# Patient Record
Sex: Female | Born: 2000
Health system: Southern US, Community
[De-identification: ages and names within clinical notes are randomized; demographics above are authoritative.]

## PROBLEM LIST (undated history)

## (undated) DIAGNOSIS — F32A Depression, unspecified: Secondary | ICD-10-CM

## (undated) DIAGNOSIS — F419 Anxiety disorder, unspecified: Secondary | ICD-10-CM

## (undated) DIAGNOSIS — J020 Streptococcal pharyngitis: Secondary | ICD-10-CM

## (undated) DIAGNOSIS — F329 Major depressive disorder, single episode, unspecified: Secondary | ICD-10-CM

## (undated) DIAGNOSIS — F4310 Post-traumatic stress disorder, unspecified: Secondary | ICD-10-CM

## (undated) DIAGNOSIS — G43909 Migraine, unspecified, not intractable, without status migrainosus: Secondary | ICD-10-CM

## (undated) HISTORY — DX: Migraine, unspecified, not intractable, without status migrainosus: G43.909

## (undated) HISTORY — DX: Post-traumatic stress disorder, unspecified: F43.10

## (undated) HISTORY — DX: Depression, unspecified: F32.A

## (undated) HISTORY — PX: TONSILLECTOMY: SUR1361

## (undated) HISTORY — DX: Anxiety disorder, unspecified: F41.9

---
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
## 2019-02-03 DIAGNOSIS — R42 Dizziness and giddiness: Secondary | ICD-10-CM | POA: Diagnosis not present

## 2019-02-03 DIAGNOSIS — F419 Anxiety disorder, unspecified: Secondary | ICD-10-CM | POA: Diagnosis not present

## 2019-02-03 DIAGNOSIS — E559 Vitamin D deficiency, unspecified: Secondary | ICD-10-CM | POA: Diagnosis not present

## 2019-02-06 DIAGNOSIS — R42 Dizziness and giddiness: Secondary | ICD-10-CM | POA: Diagnosis not present

## 2019-02-06 DIAGNOSIS — E559 Vitamin D deficiency, unspecified: Secondary | ICD-10-CM | POA: Diagnosis not present

## 2019-03-25 DIAGNOSIS — R519 Headache, unspecified: Secondary | ICD-10-CM | POA: Diagnosis not present

## 2019-03-25 DIAGNOSIS — G8929 Other chronic pain: Secondary | ICD-10-CM | POA: Diagnosis not present

## 2019-03-25 DIAGNOSIS — R42 Dizziness and giddiness: Secondary | ICD-10-CM | POA: Diagnosis not present

## 2019-06-04 DIAGNOSIS — F331 Major depressive disorder, recurrent, moderate: Secondary | ICD-10-CM | POA: Diagnosis not present

## 2019-06-11 DIAGNOSIS — F331 Major depressive disorder, recurrent, moderate: Secondary | ICD-10-CM | POA: Diagnosis not present

## 2019-08-27 DIAGNOSIS — Z3040 Encounter for surveillance of contraceptives, unspecified: Secondary | ICD-10-CM | POA: Diagnosis not present

## 2019-08-27 DIAGNOSIS — Z3046 Encounter for surveillance of implantable subdermal contraceptive: Secondary | ICD-10-CM | POA: Diagnosis not present

## 2019-10-08 ENCOUNTER — Ambulatory Visit (INDEPENDENT_AMBULATORY_CARE_PROVIDER_SITE_OTHER): Payer: BC Managed Care – PPO | Admitting: Neurology

## 2019-10-08 ENCOUNTER — Other Ambulatory Visit: Payer: Self-pay

## 2019-10-08 ENCOUNTER — Encounter: Payer: Self-pay | Admitting: Neurology

## 2019-10-08 VITALS — Temp 97.1°F | Ht 60.0 in | Wt 176.0 lb

## 2019-10-08 DIAGNOSIS — R55 Syncope and collapse: Secondary | ICD-10-CM | POA: Diagnosis not present

## 2019-10-08 DIAGNOSIS — F418 Other specified anxiety disorders: Secondary | ICD-10-CM | POA: Diagnosis not present

## 2019-10-08 DIAGNOSIS — IMO0002 Reserved for concepts with insufficient information to code with codable children: Secondary | ICD-10-CM | POA: Insufficient documentation

## 2019-10-08 DIAGNOSIS — G43709 Chronic migraine without aura, not intractable, without status migrainosus: Secondary | ICD-10-CM | POA: Diagnosis not present

## 2019-10-08 MED ORDER — ELETRIPTAN HYDROBROMIDE 40 MG PO TABS: 40.0000 mg | ORAL_TABLET | ORAL | 6 refills | Status: DC | PRN

## 2019-10-08 MED ORDER — VENLAFAXINE HCL ER 37.5 MG PO CP24: 37.5000 mg | ORAL_CAPSULE | Freq: Every day | ORAL | 11 refills | Status: DC

## 2019-10-08 NOTE — Progress Notes (Signed)
PATIENT: Lisa Mccoy DOB: August 07, 2000  Chief Complaint  Patient presents with  . New Patient (Initial Visit)    New Rm with mom- Pt reports she is here to discuss blurred vision, dizziness and faiting episodes. Last fainting spell was a couple of weeks ago and dizziness is present throughout everyday-- She reports she has tried medication for this but cannot remember the name. She also reports she struggles with migraine h/a     HISTORICAL  Lisa Mccoy is a 19 year old female, seen in request by her primary care PA Dara Lords for evaluation of chronic headache, frequent passing out spells, she is accompanied by her mother at today's clinical visit on Oct 08, 2019.  I have reviewed and summarized the referring note from the referring physician.  She is currently a Dietitian, also work part-time job as a Conservation officer, nature, reported long history of depression anxiety, taking Zoloft 50 mg daily,  She reported a history of migraine headaches since seventh grade, her typical migraine severe lateralized retro-orbital area pounding headache with associated light, noise sensitivity, over the years, her headache gradually getting worse, since 2019, she began to have daily headaches, for a while she was taking frequent over-the-counter medication such as Aleve, which does not help, she is no longer taking medicine regularly  Over the years, she was seen by primary care, neurologist in the past, for treatment of her depression anxiety, chronic migraine headaches, reported to have tried and failed amitriptyline, Topamax, Lexapro, also tried Imitrex, Maxalt as migraine abortive treatment with suboptimal response  She also reported frequent passing out spells since 2019, gradually worse, smaller spells on a daily basis, prolonged spells about couple times each month, she reported 1 typical spells 2 weeks ago, after standing on her feet for 8 or 9 hours working as a Conservation officer, nature, she went out with her friend  eating pizza, after 3-4 slice of pizza, drink soda, she was ready to check out, while waiting in line for couple minutes, she felt woozy headache, going to pass out, she was able to tell her friends, was helped by her friends to the car, then she could not remember anything from then on, then woke up 10 minutes later on her driveway, there is no tongue biting, urinary incontinence, no seizure-like activity, she did have a worsening headache after that  She reported long history of heavy menstruation, previously used progesterone implant, sometimes had menstruation lasting for 1 month, only at the beginning of 2021, she was changed to p.o. contraceptives, she has more regular menstruation now.   REVIEW OF SYSTEMS: Full 14 system review of systems performed and notable only for as above: Fatigue, blurry vision, eye pain, joint pain, aching muscles, allergy, runny nose, skin sensitivity, memory loss, confusion, headaches, numbness, weakness, dizziness, passing out, insomnia, sleepiness, restless leg, anxiety, depression, too much sleep, change in appetite disinterested activities, racing thoughts. All other review of systems were negative.  ALLERGIES: Allergies  Allergen Reactions  . Other     Seasonal Allergies      HOME MEDICATIONS: Current Outpatient Medications  Medication Sig Dispense Refill  . LO LOESTRIN FE 1 MG-10 MCG / 10 MCG tablet Take 1 tablet by mouth daily.    Marland Kitchen loratadine (CLARITIN) 10 MG tablet Take by mouth.    . naproxen sodium (ALEVE) 220 MG tablet Take 220 mg by mouth.    . sertraline (ZOLOFT) 50 MG tablet Take 50 mg by mouth 2 (two) times daily.  No current facility-administered medications for this visit.    PAST MEDICAL HISTORY: Past Medical History:  Diagnosis Date  . Anxiety   . Depression   . Migraine   . PTSD (post-traumatic stress disorder)   . Strep pharyngitis     PAST SURGICAL HISTORY: Past Surgical History:  Procedure Laterality Date  . TEAR  DUCT PROBING  2001   Performed in NICU  . TONSILLECTOMY    . TONSILLECTOMY AND ADENOIDECTOMY Bilateral 07-2010   Performed at [facility]    FAMILY HISTORY: Family History  Problem Relation Age of Onset  . Headache Mother        Headaches related to TMJ  . Depression Mother   . Anxiety disorder Mother   . Headache Father        Tension headaches  . Seizures Maternal Uncle   . Mental illness Paternal Aunt   . Drug abuse Paternal Aunt   . Depression Maternal Grandmother   . Anxiety disorder Maternal Grandmother   . Throat cancer Paternal Grandfather   . Asperger's syndrome Cousin   . ADD / ADHD Cousin     SOCIAL HISTORY: Social History   Socioeconomic History  . Marital status: Single    Spouse name: Not on file  . Number of children: Not on file  . Years of education: Not on file  . Highest education level: Not on file  Occupational History  . Not on file  Tobacco Use  . Smoking status: Never Smoker  . Smokeless tobacco: Never Used  . Tobacco comment: Dad "Vapes"  Substance and Sexual Activity  . Alcohol use: No    Alcohol/week: 0.0 standard drinks  . Drug use: No  . Sexual activity: Never    Birth control/protection: Implant  Other Topics Concern  . 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 She is doing very well. She enjoys sleeping, music, writing short stories, and watching Netflix movies.          Living with her parents. She has an adult-aged paternal half brother that does not live in the home. She has a paternal first cousin with Asperger's syndrome and another cousin with ADHD.       Her paternal aunt has been hospitalized for drug abuse and mental illness.       HC: 53.3 cm   Caffeine- uses everyday    Social Determinants of Health   Financial Resource Strain:   . Difficulty of Paying Living Expenses:   Food Insecurity:   . Worried About Sales executive in the Last Year:   . Arboriculturist in the Last Year:   Transportation Needs:   . Film/video editor (Medical):   Marland Kitchen Lack of Transportation (Non-Medical):   Physical Activity:   . Days of Exercise per Week:   . Minutes of Exercise per Session:   Stress:   . Feeling of Stress :   Social Connections:   . Frequency of Communication with Friends and Family:   . Frequency of Social Gatherings with Friends and Family:   . Attends Religious Services:   . Active Member of Clubs or Organizations:   . Attends Archivist Meetings:   Marland Kitchen Marital Status:   Intimate Partner Violence:   . Fear of Current or Ex-Partner:   . Emotionally Abused:   Marland Kitchen Physically Abused:   . Sexually Abused:  PHYSICAL EXAM   Vitals:   10/08/19 1523  Temp: (!) 97.1 F (36.2 C)  TempSrc: Temporal  Weight: 176 lb (79.8 kg)  Height: 5' (1.524 m)    Not recorded     Sitting down blood pressure 110/78, heart rate of 73, standing up 118/83 heart rate of 72, standing up for 3 minutes, 110/75 heart rate of 76  Body mass index is 34.37 kg/m.  PHYSICAL EXAMNIATION:  Gen: NAD, conversant, well nourised, well groomed                     Cardiovascular: Regular rate rhythm, no peripheral edema, warm, nontender. Eyes: Conjunctivae clear without exudates or hemorrhage Neck: Supple, no carotid bruits. Pulmonary: Clear to auscultation bilaterally   NEUROLOGICAL EXAM:  MENTAL STATUS: Speech:    Speech is normal; fluent and spontaneous with normal comprehension.  Cognition:     Orientation to time, place and person     Normal recent and remote memory     Normal Attention span and concentration     Normal Language, naming, repeating,spontaneous speech     Fund of knowledge   CRANIAL NERVES: CN II: Visual fields are full to confrontation. Pupils are round equal and briskly reactive to light. CN III, IV, VI: extraocular movement are normal. No ptosis. CN V: Facial sensation is intact to  light touch CN VII: Face is symmetric with normal eye closure  CN VIII: Hearing is normal to causal conversation. CN IX, X: Phonation is normal. CN XI: Head turning and shoulder shrug are intact  MOTOR: There is no pronator drift of out-stretched arms. Muscle bulk and tone are normal. Muscle strength is normal.  REFLEXES: Reflexes are 2+ and symmetric at the biceps, triceps, knees, and ankles. Plantar responses are flexor.  SENSORY: Intact to light touch, pinprick and vibratory sensation are intact in fingers and toes.  COORDINATION: There is no trunk or limb dysmetria noted.  GAIT/STANCE: Posture is normal. Gait is steady with normal steps, base, arm swing, and turning. Heel and toe walking are normal. Tandem gait is normal.  Romberg is absent.   DIAGNOSTIC DATA (LABS, IMAGING, TESTING) - I reviewed patient records, labs, notes, testing and imaging myself where available.   ASSESSMENT AND PLAN  Lisa Mccoy is a 19 y.o. female   Chronic migraine headaches Frequent passing out spells Depression anxiety  Most suggestive of syncope,  EEG   She did have a history of prolonged heavy menstruation for few years, only in recent months, she was started contraceptives, has better regular menstruation,  Laboratory evaluation to rule out anemia, thyroid malfunction  Effexor xr 37.5 mg daily as migraine prevention  Relpax as needed   Levert Feinstein, M.D. Ph.D.  [facility] [address], [address] [place], [place] [address] Ph: [phone] Fax: [phone]  CC: Stevphen Meuse, MD

## 2019-10-09 ENCOUNTER — Telehealth: Payer: Self-pay | Admitting: Neurology

## 2019-10-09 LAB — COMPREHENSIVE METABOLIC PANEL
ALT: 10 IU/L (ref 0–32)
AST: 13 IU/L (ref 0–40)
Albumin/Globulin Ratio: 1.7 (ref 1.2–2.2)
Albumin: 4.5 g/dL (ref 3.9–5.0)
Alkaline Phosphatase: 75 IU/L (ref 39–117)
BUN/Creatinine Ratio: 22 (ref 9–23)
BUN: 15 mg/dL (ref 6–20)
Bilirubin Total: <0.2 mg/dL (ref 0.0–1.2)
CO2: 24 mmol/L (ref 20–29)
Calcium: 9.7 mg/dL (ref 8.7–10.2)
Chloride: 103 mmol/L (ref 96–106)
Creatinine, Ser: 0.67 mg/dL (ref 0.57–1.00)
GFR calc Af Amer: 147 mL/min/{1.73_m2} (ref >59)
GFR calc non Af Amer: 128 mL/min/{1.73_m2} (ref >59)
Globulin, Total: 2.6 g/dL (ref 1.5–4.5)
Glucose: 85 mg/dL (ref 65–99)
Potassium: 4.3 mmol/L (ref 3.5–5.2)
Sodium: 138 mmol/L (ref 134–144)
Total Protein: 7.1 g/dL (ref 6.0–8.5)

## 2019-10-09 LAB — CBC WITH DIFFERENTIAL
Basophils Absolute: 0.1 10*3/uL (ref 0.0–0.2)
Basos: 1 %
EOS (ABSOLUTE): 0.3 10*3/uL (ref 0.0–0.4)
Eos: 3 %
Hematocrit: 38 % (ref 34.0–46.6)
Hemoglobin: 12.1 g/dL (ref 11.1–15.9)
Immature Grans (Abs): 0 10*3/uL (ref 0.0–0.1)
Immature Granulocytes: 0 %
Lymphocytes Absolute: 3.5 10*3/uL — ABNORMAL HIGH (ref 0.7–3.1)
Lymphs: 28 %
MCH: 26.8 pg (ref 26.6–33.0)
MCHC: 31.8 g/dL (ref 31.5–35.7)
MCV: 84 fL (ref 79–97)
Monocytes Absolute: 0.8 10*3/uL (ref 0.1–0.9)
Monocytes: 6 %
Neutrophils Absolute: 7.7 10*3/uL — ABNORMAL HIGH (ref 1.4–7.0)
Neutrophils: 62 %
RBC: 4.51 x10E6/uL (ref 3.77–5.28)
RDW: 12.5 % (ref 11.7–15.4)
WBC: 12.4 10*3/uL — ABNORMAL HIGH (ref 3.4–10.8)

## 2019-10-09 LAB — FERRITIN: Ferritin: 23 ng/mL (ref 15–77)

## 2019-10-09 LAB — RPR: RPR Ser Ql: NONREACTIVE

## 2019-10-09 LAB — VITAMIN B12: Vitamin B-12: 406 pg/mL (ref 232–1245)

## 2019-10-09 LAB — TSH: TSH: 3.82 u[IU]/mL (ref 0.450–4.500)

## 2019-10-09 MED ORDER — SUMATRIPTAN SUCCINATE 100 MG PO TABS: 100.0000 mg | ORAL_TABLET | Freq: Once | ORAL | 5 refills | Status: DC | PRN

## 2019-10-09 NOTE — Telephone Encounter (Signed)
I called pt to discuss. No answer, left a message asking her to call us back. 

## 2019-10-09 NOTE — Telephone Encounter (Signed)
Patient called after hours center re: Rx. I advised her that Dr. Terrace Arabia changed her triptan from Relpax to imitrex generic, due to cost. Patient had no further questions.

## 2019-10-09 NOTE — Telephone Encounter (Signed)
Please call patient, laboratory evaluation showed elevated WBC 12.4, with elevated absolute neutrophil, please ask her if she has any signs of infection, such as UTI, upper respiratory infection, if she does, may see her primary care  Rest of the laboratory evaluation showed no significant abnormalities.

## 2019-10-09 NOTE — Telephone Encounter (Signed)
SUMAtriptan (IMITREX) 100 MG tablet 100 mg, Once PRN 5 ordered  Summary: Take 1 tablet (100 mg total) by mouth once as needed for up to 1 dose for migraine. May repeat in 2 hours if headache persists or recurs., Starting Fri 10/09/2019, Normal Dose, Route, Frequency: 100 mg, Oral, Once PRN  Start: 10/09/2019  Ord/Sold: 10/09/2019 (O)  Report  Taking:  Long-term:  Pharmacy: [id number] - [place], [place] - [address]  Med Dose History  ChangeDiscontinue   Patient Sig: Take 1 tablet (100 mg total) by mouth once as needed for up to 1 dose for migraine. May repeat in 2 hours if headache persists or recurs.   Ordered on: 10/09/2019   Authorized by: Levert Feinstein   Dispense: 12 tablet

## 2019-10-09 NOTE — Telephone Encounter (Signed)
I called pt and discussed this with her. She reports having elevated WBC for the past few times her labs have been checked but denies s/s of an infection. She will follow up with her PCP regarding this chronic WBC elevation.  Pt reports that eletriptan is too expensive, even with insurance coverage and the goodrx coupon. She is wondering if Dr. Terrace Arabia can switch her to imitrex. She would like to pick up imitrex today if possible.

## 2019-10-09 NOTE — Addendum Note (Signed)
Addended by: Levert Feinstein on: 10/09/2019 12:16 PM   Modules accepted: Orders

## 2019-10-12 DIAGNOSIS — G44201 Tension-type headache, unspecified, intractable: Secondary | ICD-10-CM | POA: Diagnosis not present

## 2019-10-12 DIAGNOSIS — M542 Cervicalgia: Secondary | ICD-10-CM | POA: Diagnosis not present

## 2019-10-12 DIAGNOSIS — M9902 Segmental and somatic dysfunction of thoracic region: Secondary | ICD-10-CM | POA: Diagnosis not present

## 2019-10-12 DIAGNOSIS — M9901 Segmental and somatic dysfunction of cervical region: Secondary | ICD-10-CM | POA: Diagnosis not present

## 2019-10-12 NOTE — Telephone Encounter (Signed)
I called pt. She did received her imitrex RX from [facility]. Pt had no questions or concerns but was encouraged to call back if questions or concerns arise.

## 2019-10-15 DIAGNOSIS — M9902 Segmental and somatic dysfunction of thoracic region: Secondary | ICD-10-CM | POA: Diagnosis not present

## 2019-10-15 DIAGNOSIS — G44201 Tension-type headache, unspecified, intractable: Secondary | ICD-10-CM | POA: Diagnosis not present

## 2019-10-15 DIAGNOSIS — M9901 Segmental and somatic dysfunction of cervical region: Secondary | ICD-10-CM | POA: Diagnosis not present

## 2019-10-15 DIAGNOSIS — M542 Cervicalgia: Secondary | ICD-10-CM | POA: Diagnosis not present

## 2019-10-19 ENCOUNTER — Telehealth: Payer: Self-pay | Admitting: Neurology

## 2019-10-19 DIAGNOSIS — G44201 Tension-type headache, unspecified, intractable: Secondary | ICD-10-CM | POA: Diagnosis not present

## 2019-10-19 DIAGNOSIS — M9902 Segmental and somatic dysfunction of thoracic region: Secondary | ICD-10-CM | POA: Diagnosis not present

## 2019-10-19 DIAGNOSIS — M542 Cervicalgia: Secondary | ICD-10-CM | POA: Diagnosis not present

## 2019-10-19 DIAGNOSIS — M9901 Segmental and somatic dysfunction of cervical region: Secondary | ICD-10-CM | POA: Diagnosis not present

## 2019-10-19 NOTE — Telephone Encounter (Signed)
BCBS auth: NPR Ref # [id number] order sent to [facility]. They will reach out to the patient to schedule.

## 2019-10-21 DIAGNOSIS — M9902 Segmental and somatic dysfunction of thoracic region: Secondary | ICD-10-CM | POA: Diagnosis not present

## 2019-10-21 DIAGNOSIS — G44201 Tension-type headache, unspecified, intractable: Secondary | ICD-10-CM | POA: Diagnosis not present

## 2019-10-21 DIAGNOSIS — M9901 Segmental and somatic dysfunction of cervical region: Secondary | ICD-10-CM | POA: Diagnosis not present

## 2019-10-21 DIAGNOSIS — M542 Cervicalgia: Secondary | ICD-10-CM | POA: Diagnosis not present

## 2019-10-22 DIAGNOSIS — M542 Cervicalgia: Secondary | ICD-10-CM | POA: Diagnosis not present

## 2019-10-22 DIAGNOSIS — M9901 Segmental and somatic dysfunction of cervical region: Secondary | ICD-10-CM | POA: Diagnosis not present

## 2019-10-22 DIAGNOSIS — G44201 Tension-type headache, unspecified, intractable: Secondary | ICD-10-CM | POA: Diagnosis not present

## 2019-10-22 DIAGNOSIS — M9902 Segmental and somatic dysfunction of thoracic region: Secondary | ICD-10-CM | POA: Diagnosis not present

## 2019-10-28 DIAGNOSIS — M9901 Segmental and somatic dysfunction of cervical region: Secondary | ICD-10-CM | POA: Diagnosis not present

## 2019-10-28 DIAGNOSIS — G44201 Tension-type headache, unspecified, intractable: Secondary | ICD-10-CM | POA: Diagnosis not present

## 2019-10-28 DIAGNOSIS — M542 Cervicalgia: Secondary | ICD-10-CM | POA: Diagnosis not present

## 2019-10-28 DIAGNOSIS — M9902 Segmental and somatic dysfunction of thoracic region: Secondary | ICD-10-CM | POA: Diagnosis not present

## 2019-10-29 DIAGNOSIS — M9902 Segmental and somatic dysfunction of thoracic region: Secondary | ICD-10-CM | POA: Diagnosis not present

## 2019-10-29 DIAGNOSIS — M542 Cervicalgia: Secondary | ICD-10-CM | POA: Diagnosis not present

## 2019-10-29 DIAGNOSIS — G44201 Tension-type headache, unspecified, intractable: Secondary | ICD-10-CM | POA: Diagnosis not present

## 2019-10-29 DIAGNOSIS — M9901 Segmental and somatic dysfunction of cervical region: Secondary | ICD-10-CM | POA: Diagnosis not present

## 2019-10-30 DIAGNOSIS — M9901 Segmental and somatic dysfunction of cervical region: Secondary | ICD-10-CM | POA: Diagnosis not present

## 2019-10-30 DIAGNOSIS — M9902 Segmental and somatic dysfunction of thoracic region: Secondary | ICD-10-CM | POA: Diagnosis not present

## 2019-10-30 DIAGNOSIS — G44201 Tension-type headache, unspecified, intractable: Secondary | ICD-10-CM | POA: Diagnosis not present

## 2019-10-30 DIAGNOSIS — M542 Cervicalgia: Secondary | ICD-10-CM | POA: Diagnosis not present

## 2019-11-03 DIAGNOSIS — M9902 Segmental and somatic dysfunction of thoracic region: Secondary | ICD-10-CM | POA: Diagnosis not present

## 2019-11-03 DIAGNOSIS — M542 Cervicalgia: Secondary | ICD-10-CM | POA: Diagnosis not present

## 2019-11-03 DIAGNOSIS — G44201 Tension-type headache, unspecified, intractable: Secondary | ICD-10-CM | POA: Diagnosis not present

## 2019-11-03 DIAGNOSIS — M9901 Segmental and somatic dysfunction of cervical region: Secondary | ICD-10-CM | POA: Diagnosis not present

## 2019-11-04 DIAGNOSIS — M9902 Segmental and somatic dysfunction of thoracic region: Secondary | ICD-10-CM | POA: Diagnosis not present

## 2019-11-04 DIAGNOSIS — M9901 Segmental and somatic dysfunction of cervical region: Secondary | ICD-10-CM | POA: Diagnosis not present

## 2019-11-04 DIAGNOSIS — M542 Cervicalgia: Secondary | ICD-10-CM | POA: Diagnosis not present

## 2019-11-04 DIAGNOSIS — G44201 Tension-type headache, unspecified, intractable: Secondary | ICD-10-CM | POA: Diagnosis not present

## 2019-11-05 DIAGNOSIS — G44201 Tension-type headache, unspecified, intractable: Secondary | ICD-10-CM | POA: Diagnosis not present

## 2019-11-05 DIAGNOSIS — M9901 Segmental and somatic dysfunction of cervical region: Secondary | ICD-10-CM | POA: Diagnosis not present

## 2019-11-05 DIAGNOSIS — M542 Cervicalgia: Secondary | ICD-10-CM | POA: Diagnosis not present

## 2019-11-05 DIAGNOSIS — M9902 Segmental and somatic dysfunction of thoracic region: Secondary | ICD-10-CM | POA: Diagnosis not present

## 2019-11-09 ENCOUNTER — Other Ambulatory Visit: Payer: BC Managed Care – PPO

## 2019-11-09 DIAGNOSIS — M9902 Segmental and somatic dysfunction of thoracic region: Secondary | ICD-10-CM | POA: Diagnosis not present

## 2019-11-09 DIAGNOSIS — M9901 Segmental and somatic dysfunction of cervical region: Secondary | ICD-10-CM | POA: Diagnosis not present

## 2019-11-09 DIAGNOSIS — M542 Cervicalgia: Secondary | ICD-10-CM | POA: Diagnosis not present

## 2019-11-09 DIAGNOSIS — G44201 Tension-type headache, unspecified, intractable: Secondary | ICD-10-CM | POA: Diagnosis not present

## 2019-11-12 ENCOUNTER — Ambulatory Visit (INDEPENDENT_AMBULATORY_CARE_PROVIDER_SITE_OTHER): Payer: BC Managed Care – PPO | Admitting: Neurology

## 2019-11-12 DIAGNOSIS — R55 Syncope and collapse: Secondary | ICD-10-CM | POA: Diagnosis not present

## 2019-11-12 DIAGNOSIS — IMO0002 Reserved for concepts with insufficient information to code with codable children: Secondary | ICD-10-CM

## 2019-11-12 DIAGNOSIS — M9902 Segmental and somatic dysfunction of thoracic region: Secondary | ICD-10-CM | POA: Diagnosis not present

## 2019-11-12 DIAGNOSIS — G44201 Tension-type headache, unspecified, intractable: Secondary | ICD-10-CM | POA: Diagnosis not present

## 2019-11-12 DIAGNOSIS — M9901 Segmental and somatic dysfunction of cervical region: Secondary | ICD-10-CM | POA: Diagnosis not present

## 2019-11-12 DIAGNOSIS — M542 Cervicalgia: Secondary | ICD-10-CM | POA: Diagnosis not present

## 2019-11-16 DIAGNOSIS — M9902 Segmental and somatic dysfunction of thoracic region: Secondary | ICD-10-CM | POA: Diagnosis not present

## 2019-11-16 DIAGNOSIS — M542 Cervicalgia: Secondary | ICD-10-CM | POA: Diagnosis not present

## 2019-11-16 DIAGNOSIS — M9901 Segmental and somatic dysfunction of cervical region: Secondary | ICD-10-CM | POA: Diagnosis not present

## 2019-11-16 DIAGNOSIS — G44201 Tension-type headache, unspecified, intractable: Secondary | ICD-10-CM | POA: Diagnosis not present

## 2019-11-18 DIAGNOSIS — M9901 Segmental and somatic dysfunction of cervical region: Secondary | ICD-10-CM | POA: Diagnosis not present

## 2019-11-18 DIAGNOSIS — M9902 Segmental and somatic dysfunction of thoracic region: Secondary | ICD-10-CM | POA: Diagnosis not present

## 2019-11-18 DIAGNOSIS — M542 Cervicalgia: Secondary | ICD-10-CM | POA: Diagnosis not present

## 2019-11-18 DIAGNOSIS — G44201 Tension-type headache, unspecified, intractable: Secondary | ICD-10-CM | POA: Diagnosis not present

## 2019-11-19 DIAGNOSIS — J0100 Acute maxillary sinusitis, unspecified: Secondary | ICD-10-CM | POA: Diagnosis not present

## 2019-11-19 DIAGNOSIS — J029 Acute pharyngitis, unspecified: Secondary | ICD-10-CM | POA: Diagnosis not present

## 2019-11-23 DIAGNOSIS — T886XXA Anaphylactic reaction due to adverse effect of correct drug or medicament properly administered, initial encounter: Secondary | ICD-10-CM | POA: Diagnosis not present

## 2019-11-23 DIAGNOSIS — T782XXA Anaphylactic shock, unspecified, initial encounter: Secondary | ICD-10-CM | POA: Diagnosis not present

## 2019-11-23 DIAGNOSIS — T361X5A Adverse effect of cephalosporins and other beta-lactam antibiotics, initial encounter: Secondary | ICD-10-CM | POA: Diagnosis not present

## 2019-11-23 DIAGNOSIS — R21 Rash and other nonspecific skin eruption: Secondary | ICD-10-CM | POA: Diagnosis not present

## 2019-11-24 DIAGNOSIS — L500 Allergic urticaria: Secondary | ICD-10-CM | POA: Diagnosis not present

## 2019-11-24 DIAGNOSIS — L509 Urticaria, unspecified: Secondary | ICD-10-CM | POA: Diagnosis not present

## 2019-11-30 NOTE — Procedures (Signed)
   HISTORY: 19 years old female presented with episode of blurry vision, dizziness, fainting episode  TECHNIQUE:  This is a routine 16 channel EEG recording with one channel devoted to a limited EKG recording.  It was performed during wakefulness, drowsiness and asleep.  Hyperventilation and photic stimulation were performed as activating procedures.  There are minimum muscle and movement artifact noted.  Upon maximum arousal, posterior dominant waking rhythm consistent of rhythmic alpha range activity, with mixed frequency of alpha and beta activities are symmetric over the bilateral posterior derivations and attenuated with eye opening.  Hyperventilation produced mild/moderate buildup with higher amplitude and the slower activities noted.  Photic stimulation did not alter the tracing.  During EEG recording, patient developed drowsiness and entered sleep, sleep EEG demonstrated architecture, there were frontal centrally dominant vertex waves and symmetric sleep spindles noted.  During EEG recording, there was no epileptiform discharge noted.  EKG demonstrate sinus rhythm, with regular heart rhythm  CONCLUSION: This is a  normal awake and sleep EEG.  There is no electrodiagnostic evidence of epileptiform discharge.  Levert Feinstein, M.D. Ph.D.  [facility] [address] [place], [place] [address] Phone: [phone] Fax:      Within L4 and theta range

## 2019-12-02 ENCOUNTER — Telehealth: Payer: Self-pay | Admitting: *Deleted

## 2019-12-02 NOTE — Telephone Encounter (Signed)
Pt's mother called our office for EEG results:  CONCLUSION: This is a  normal awake and sleep EEG.  There is no electrodiagnostic evidence of epileptiform discharge.  Her call was disconnected before being transferred to me. I returned her call and left a message (ok per DPR) that her EEG was normal. Additionally, I informed her that the patient should continue the prescribed medications, document any events of syncope and keep her pending follow up. I provided our number to call back with any questions.

## 2019-12-04 DIAGNOSIS — G44201 Tension-type headache, unspecified, intractable: Secondary | ICD-10-CM | POA: Diagnosis not present

## 2019-12-04 DIAGNOSIS — M542 Cervicalgia: Secondary | ICD-10-CM | POA: Diagnosis not present

## 2019-12-04 DIAGNOSIS — M9902 Segmental and somatic dysfunction of thoracic region: Secondary | ICD-10-CM | POA: Diagnosis not present

## 2019-12-04 DIAGNOSIS — M9901 Segmental and somatic dysfunction of cervical region: Secondary | ICD-10-CM | POA: Diagnosis not present

## 2019-12-07 ENCOUNTER — Other Ambulatory Visit: Payer: Self-pay

## 2019-12-09 DIAGNOSIS — M9902 Segmental and somatic dysfunction of thoracic region: Secondary | ICD-10-CM | POA: Diagnosis not present

## 2019-12-09 DIAGNOSIS — M542 Cervicalgia: Secondary | ICD-10-CM | POA: Diagnosis not present

## 2019-12-09 DIAGNOSIS — M9901 Segmental and somatic dysfunction of cervical region: Secondary | ICD-10-CM | POA: Diagnosis not present

## 2019-12-09 DIAGNOSIS — G44201 Tension-type headache, unspecified, intractable: Secondary | ICD-10-CM | POA: Diagnosis not present

## 2019-12-11 DIAGNOSIS — G44201 Tension-type headache, unspecified, intractable: Secondary | ICD-10-CM | POA: Diagnosis not present

## 2019-12-11 DIAGNOSIS — M9902 Segmental and somatic dysfunction of thoracic region: Secondary | ICD-10-CM | POA: Diagnosis not present

## 2019-12-11 DIAGNOSIS — M542 Cervicalgia: Secondary | ICD-10-CM | POA: Diagnosis not present

## 2019-12-11 DIAGNOSIS — M9901 Segmental and somatic dysfunction of cervical region: Secondary | ICD-10-CM | POA: Diagnosis not present

## 2019-12-15 DIAGNOSIS — M542 Cervicalgia: Secondary | ICD-10-CM | POA: Diagnosis not present

## 2019-12-15 DIAGNOSIS — M9901 Segmental and somatic dysfunction of cervical region: Secondary | ICD-10-CM | POA: Diagnosis not present

## 2019-12-15 DIAGNOSIS — M9902 Segmental and somatic dysfunction of thoracic region: Secondary | ICD-10-CM | POA: Diagnosis not present

## 2019-12-15 DIAGNOSIS — G44201 Tension-type headache, unspecified, intractable: Secondary | ICD-10-CM | POA: Diagnosis not present

## 2019-12-21 DIAGNOSIS — G44201 Tension-type headache, unspecified, intractable: Secondary | ICD-10-CM | POA: Diagnosis not present

## 2019-12-21 DIAGNOSIS — M9902 Segmental and somatic dysfunction of thoracic region: Secondary | ICD-10-CM | POA: Diagnosis not present

## 2019-12-21 DIAGNOSIS — M542 Cervicalgia: Secondary | ICD-10-CM | POA: Diagnosis not present

## 2019-12-21 DIAGNOSIS — M9901 Segmental and somatic dysfunction of cervical region: Secondary | ICD-10-CM | POA: Diagnosis not present

## 2019-12-28 DIAGNOSIS — M9902 Segmental and somatic dysfunction of thoracic region: Secondary | ICD-10-CM | POA: Diagnosis not present

## 2019-12-28 DIAGNOSIS — G44201 Tension-type headache, unspecified, intractable: Secondary | ICD-10-CM | POA: Diagnosis not present

## 2019-12-28 DIAGNOSIS — M9901 Segmental and somatic dysfunction of cervical region: Secondary | ICD-10-CM | POA: Diagnosis not present

## 2019-12-28 DIAGNOSIS — M542 Cervicalgia: Secondary | ICD-10-CM | POA: Diagnosis not present

## 2019-12-31 ENCOUNTER — Ambulatory Visit: Payer: BC Managed Care – PPO | Admitting: Neurology

## 2020-01-19 DIAGNOSIS — Z1329 Encounter for screening for other suspected endocrine disorder: Secondary | ICD-10-CM | POA: Diagnosis not present

## 2020-01-19 DIAGNOSIS — Z13228 Encounter for screening for other metabolic disorders: Secondary | ICD-10-CM | POA: Diagnosis not present

## 2020-01-19 DIAGNOSIS — Z130 Encounter for screening for diseases of the blood and blood-forming organs and certain disorders involving the immune mechanism: Secondary | ICD-10-CM | POA: Diagnosis not present

## 2020-01-19 DIAGNOSIS — Z1321 Encounter for screening for nutritional disorder: Secondary | ICD-10-CM | POA: Diagnosis not present

## 2020-01-19 DIAGNOSIS — Z889 Allergy status to unspecified drugs, medicaments and biological substances status: Secondary | ICD-10-CM | POA: Diagnosis not present

## 2020-01-19 DIAGNOSIS — G43009 Migraine without aura, not intractable, without status migrainosus: Secondary | ICD-10-CM | POA: Diagnosis not present

## 2020-03-11 DIAGNOSIS — J019 Acute sinusitis, unspecified: Secondary | ICD-10-CM | POA: Diagnosis not present

## 2020-03-14 DIAGNOSIS — R051 Acute cough: Secondary | ICD-10-CM | POA: Diagnosis not present

## 2020-03-14 DIAGNOSIS — Z20828 Contact with and (suspected) exposure to other viral communicable diseases: Secondary | ICD-10-CM | POA: Diagnosis not present

## 2020-03-14 DIAGNOSIS — R0981 Nasal congestion: Secondary | ICD-10-CM | POA: Diagnosis not present

## 2020-03-14 DIAGNOSIS — J069 Acute upper respiratory infection, unspecified: Secondary | ICD-10-CM | POA: Diagnosis not present

## 2020-03-14 DIAGNOSIS — J209 Acute bronchitis, unspecified: Secondary | ICD-10-CM | POA: Diagnosis not present

## 2020-03-28 DIAGNOSIS — Z889 Allergy status to unspecified drugs, medicaments and biological substances status: Secondary | ICD-10-CM | POA: Diagnosis not present

## 2020-03-30 DIAGNOSIS — M62838 Other muscle spasm: Secondary | ICD-10-CM | POA: Diagnosis not present

## 2020-03-30 DIAGNOSIS — M542 Cervicalgia: Secondary | ICD-10-CM | POA: Diagnosis not present

## 2020-03-30 DIAGNOSIS — M545 Low back pain, unspecified: Secondary | ICD-10-CM | POA: Diagnosis not present

## 2020-03-30 DIAGNOSIS — E669 Obesity, unspecified: Secondary | ICD-10-CM | POA: Diagnosis not present

## 2020-03-30 DIAGNOSIS — Z7689 Persons encountering health services in other specified circumstances: Secondary | ICD-10-CM | POA: Diagnosis not present

## 2020-03-30 DIAGNOSIS — G8929 Other chronic pain: Secondary | ICD-10-CM | POA: Diagnosis not present

## 2020-04-28 DIAGNOSIS — F419 Anxiety disorder, unspecified: Secondary | ICD-10-CM | POA: Diagnosis not present

## 2020-04-28 DIAGNOSIS — E669 Obesity, unspecified: Secondary | ICD-10-CM | POA: Diagnosis not present

## 2020-04-28 DIAGNOSIS — F32A Depression, unspecified: Secondary | ICD-10-CM | POA: Diagnosis not present

## 2020-04-28 DIAGNOSIS — Z7689 Persons encountering health services in other specified circumstances: Secondary | ICD-10-CM | POA: Diagnosis not present

## 2024-05-20 ENCOUNTER — Other Ambulatory Visit: Payer: Self-pay

## 2024-05-20 ENCOUNTER — Ambulatory Visit
Admission: RE | Admit: 2024-05-20 | Discharge: 2024-05-20 | Disposition: A | Discharge status: Home / Self Care | Source: Ambulatory Visit | Attending: Family Medicine | Admitting: Family Medicine

## 2024-05-20 VITALS — BP 116/84 | HR 104 | Temp 98.6°F | Resp 20

## 2024-05-20 DIAGNOSIS — J069 Acute upper respiratory infection, unspecified: Secondary | ICD-10-CM | POA: Diagnosis not present

## 2024-05-20 DIAGNOSIS — H9203 Otalgia, bilateral: Secondary | ICD-10-CM

## 2024-05-20 NOTE — ED Triage Notes (Signed)
 Pt states that her husband has the Flu x 5 days. Pt states yesterday she started coughing. Pt took home Flu/COVID test and both were negative. Pt states both her ears hurt and she has a hx of ear infection. Pt c/o HA, bilateral ear pain, and congestion. Pt denies any fevers. Pt has taken Tylenol and theracold/flu with no relief.

## 2024-05-20 NOTE — Discharge Instructions (Addendum)
 Over-the-counter medicines for symptoms such as Sudafed and Flonase  for congestion, Robitussin for cough, ibuprofen  and acetaminophen for pain

## 2024-05-20 NOTE — ED Provider Notes (Signed)
 [facility]    CSN: [id number] Arrival date & time: 05/20/24  1318      History   Chief Complaint Chief Complaint  Patient presents with   Ear Fullness    My husband has the flu. I've been trying to stay away from him as much as possible. I started coughing and my ears are hurting really bad. I'm notorious to get ear infections. I would like to be tested for everything possible. - Entered by patient   Otalgia    HPI Lisa Mccoy is a 23 y.o. female.   The history is provided by the patient.  Ear Fullness  Otalgia Exposed to flu at home, has had a cough nasal congestion and rhinorrhea since yesterday now complaining of bilateral ear pain, states her ears itch and hurt.  States she is prone to ear infections she took home COVID and flu test last night this morning which were negative.  Denies fever, chills, sweats, nausea, vomiting, diarrhea, swollen glands.  Had some wheezing last night none today no history of asthma.  Admits headache and fatigue which are chronic  Past Medical History:  Diagnosis Date   Anxiety    Depression    Migraine    PTSD (post-traumatic stress disorder)    Strep pharyngitis     Patient Active Problem List   Diagnosis Date Noted   Chronic migraine 10/08/2019   Syncope 10/08/2019   Depression with anxiety 10/08/2019   Anxiety state 12/07/2015   Depression 12/07/2015   Delayed sleep phase syndrome 12/07/2015   Chronic daily headache 06/09/2015    Past Surgical History:  Procedure Laterality Date   TEAR DUCT PROBING  2001   Performed in NICU   TONSILLECTOMY     TONSILLECTOMY AND ADENOIDECTOMY Bilateral 07-2010   Performed at [facility]    OB History   No obstetric history on file.      Home Medications    Prior to Admission medications  Medication Sig Start Date End Date Taking? Authorizing Provider  Armodafinil 250 MG tablet Take 250 mg by mouth daily.   Yes [provider]  celecoxib  (CELEBREX) 200 MG capsule Take 200 mg by mouth 2 (two) times daily.   Yes [provider]  lamoTRIgine (LAMICTAL) 150 MG tablet Take 150 mg by mouth 2 (two) times daily.   Yes [provider]  rizatriptan (MAXALT-MLT) 5 MG disintegrating tablet Take 5 mg by mouth as needed for migraine. May repeat in 2 hours if needed   Yes [provider]  loratadine (CLARITIN) 10 MG tablet Take by mouth.    [provider]  naproxen sodium (ALEVE) 220 MG tablet Take 220 mg by mouth.    [provider]    Family History Family History  Problem Relation Age of Onset   Headache Mother        Headaches related to TMJ   Depression Mother    Anxiety disorder Mother    Headache Father        Tension headaches   Seizures Maternal Uncle    Mental illness Paternal Aunt    Drug abuse Paternal Aunt    Depression Maternal Grandmother    Anxiety disorder Maternal Grandmother    Throat cancer Paternal Grandfather    Asperger's syndrome Cousin    ADD / ADHD Cousin     Social History Social History[1]   Allergies   Amoxicillin , Cephalosporins, Penicillins, and Other   Review of Systems Review  of Systems  HENT:  Positive for ear pain.      Physical Exam Triage Vital Signs ED Triage Vitals  Encounter Vitals Group     BP 05/20/24 1339 116/84     Girls Systolic BP Percentile --      Girls Diastolic BP Percentile --      Boys Systolic BP Percentile --      Boys Diastolic BP Percentile --      Pulse Rate 05/20/24 1339 (!) 104     Resp 05/20/24 1339 20     Temp 05/20/24 1339 98.6 F (37 C)     Temp Source 05/20/24 1339 Oral     SpO2 05/20/24 1339 97 %     Weight --      Height --      Head Circumference --      Peak Flow --      Pain Score 05/20/24 1332 10     Pain Loc --      Pain Education --      Exclude from Growth Chart --    No data found.  Updated Vital Signs BP 116/84 (BP Location: Right Arm)   Pulse (!) 104   Temp 98.6 F (37 C)  (Oral)   Resp 20   LMP 04/29/2024 (Exact Date)   SpO2 97%   Visual Acuity Right Eye Distance:   Left Eye Distance:   Bilateral Distance:    Right Eye Near:   Left Eye Near:    Bilateral Near:     Physical Exam Vitals and nursing note reviewed.  Constitutional:      Appearance: She is not ill-appearing.  HENT:     Head: Normocephalic and atraumatic.     Right Ear: Tympanic membrane and ear canal normal.     Left Ear: Tympanic membrane and ear canal normal.     Ears:     Comments: Normal landmarks no erythema retraction, bulging or effusion    Nose: Congestion present. No rhinorrhea.     Mouth/Throat:     Mouth: Mucous membranes are moist.     Pharynx: Oropharynx is clear. No posterior oropharyngeal erythema.  Eyes:     Conjunctiva/sclera: Conjunctivae normal.  Cardiovascular:     Rate and Rhythm: Normal rate.     Heart sounds: Normal heart sounds.  Pulmonary:     Effort: Pulmonary effort is normal. No respiratory distress.     Breath sounds: Normal breath sounds. No wheezing, rhonchi or rales.  Musculoskeletal:     Cervical back: Neck supple.  Lymphadenopathy:     Cervical: No cervical adenopathy.  Skin:    General: Skin is warm.  Neurological:     Mental Status: She is alert and oriented to person, place, and time.      UC Treatments / Results  Labs (all labs ordered are listed, but only abnormal results are displayed) Labs Reviewed - No data to display  EKG   Radiology No results found.  Procedures Procedures (including critical care time)  Medications Ordered in UC Medications - No data to display  Initial Impression / Assessment and Plan / UC Course  I have reviewed the triage vital signs and the nursing notes.  Pertinent labs & imaging results that were available during my care of the patient were reviewed by me and considered in my medical decision making (see chart for details).     23 year old female with nasal congestion, rhinorrhea,  cough and ear pain exposed to flu at  home multiple home COVID and flu tests were negative, she was concerned she might have an ear infection.  Her ear exam is normal bilaterally she has mild nasal congestion on exam otherwise rest of exam is normal.  Recommend OTC meds for symptomatic relief follow-up as needed Final Clinical Impressions(s) / UC Diagnoses   Final diagnoses:  Acute upper respiratory infection  Otalgia of both ears     Discharge Instructions      Over-the-counter medicines for symptoms such as Sudafed and Flonase  for congestion, Robitussin for cough, ibuprofen  and acetaminophen for pain    ED Prescriptions   None    PDMP not reviewed this encounter.    [1]  Social History Tobacco Use   Smoking status: Never   Smokeless tobacco: Never   Tobacco comments:    Dad Vapes  Vaping Use   Vaping status: Never Used  Substance Use Topics   Alcohol use: Yes    Comment: occ   Drug use: No     Tomio Kirk, PA 05/20/24 1349  "
